# Patient Record
Sex: Female | Born: 1975 | Race: White | Hispanic: No | Marital: Married
Health system: Southern US, Community
[De-identification: ages and names within clinical notes are randomized; demographics above are authoritative.]

---

## 1999-03-20 ENCOUNTER — Other Ambulatory Visit: Admission: RE | Admit: 1999-03-20 | Discharge: 1999-03-20 | Payer: Self-pay | Admitting: *Deleted

## 2000-01-29 ENCOUNTER — Other Ambulatory Visit: Admission: RE | Admit: 2000-01-29 | Discharge: 2000-01-29 | Payer: Self-pay | Admitting: Gynecology

## 2000-07-20 ENCOUNTER — Emergency Department (HOSPITAL_COMMUNITY): Admission: EM | Admit: 2000-07-20 | Discharge: 2000-07-20 | Payer: Self-pay | Admitting: Emergency Medicine

## 2001-02-03 ENCOUNTER — Other Ambulatory Visit: Admission: RE | Admit: 2001-02-03 | Discharge: 2001-02-03 | Payer: Self-pay | Admitting: Gynecology

## 2001-08-05 ENCOUNTER — Other Ambulatory Visit: Admission: RE | Admit: 2001-08-05 | Discharge: 2001-08-05 | Payer: Self-pay | Admitting: Gynecology

## 2002-03-16 ENCOUNTER — Other Ambulatory Visit: Admission: RE | Admit: 2002-03-16 | Discharge: 2002-03-16 | Payer: Self-pay | Admitting: Gynecology

## 2003-03-16 ENCOUNTER — Other Ambulatory Visit: Admission: RE | Admit: 2003-03-16 | Discharge: 2003-03-16 | Payer: Self-pay | Admitting: Gynecology

## 2004-04-04 ENCOUNTER — Other Ambulatory Visit: Admission: RE | Admit: 2004-04-04 | Discharge: 2004-04-04 | Payer: Self-pay | Admitting: Gynecology

## 2005-01-02 ENCOUNTER — Inpatient Hospital Stay (HOSPITAL_COMMUNITY): Admission: AD | Admit: 2005-01-02 | Discharge: 2005-01-02 | Payer: Self-pay | Admitting: Obstetrics and Gynecology

## 2005-03-16 ENCOUNTER — Inpatient Hospital Stay (HOSPITAL_COMMUNITY): Admission: AD | Admit: 2005-03-16 | Discharge: 2005-03-19 | Payer: Self-pay | Admitting: Obstetrics and Gynecology

## 2005-04-27 ENCOUNTER — Other Ambulatory Visit: Admission: RE | Admit: 2005-04-27 | Discharge: 2005-04-27 | Payer: Self-pay | Admitting: Obstetrics and Gynecology

## 2006-08-05 ENCOUNTER — Other Ambulatory Visit: Admission: RE | Admit: 2006-08-05 | Discharge: 2006-08-05 | Payer: Self-pay | Admitting: Gynecology

## 2014-04-07 ENCOUNTER — Other Ambulatory Visit (HOSPITAL_COMMUNITY): Payer: Self-pay | Admitting: Obstetrics and Gynecology

## 2014-04-07 DIAGNOSIS — N971 Female infertility of tubal origin: Secondary | ICD-10-CM

## 2014-04-09 ENCOUNTER — Ambulatory Visit (HOSPITAL_COMMUNITY)
Admission: RE | Admit: 2014-04-09 | Discharge: 2014-04-09 | Disposition: A | Discharge status: Home / Self Care | Payer: Federal, State, Local not specified - PPO | Source: Ambulatory Visit | Attending: Obstetrics and Gynecology | Admitting: Obstetrics and Gynecology

## 2014-04-09 DIAGNOSIS — Z3049 Encounter for surveillance of other contraceptives: Secondary | ICD-10-CM | POA: Insufficient documentation

## 2014-04-09 DIAGNOSIS — N971 Female infertility of tubal origin: Secondary | ICD-10-CM

## 2014-04-09 MED ORDER — IOHEXOL 300 MG/ML  SOLN
20.0000 mL | Freq: Once | INTRAMUSCULAR | Status: AC | PRN
  Administered 2014-04-09: 5 mL

## 2014-04-12 ENCOUNTER — Other Ambulatory Visit (HOSPITAL_COMMUNITY): Payer: Self-pay | Admitting: Obstetrics and Gynecology

## 2014-04-12 DIAGNOSIS — Z308 Encounter for other contraceptive management: Secondary | ICD-10-CM

## 2014-08-27 ENCOUNTER — Ambulatory Visit (HOSPITAL_COMMUNITY)
Admission: RE | Admit: 2014-08-27 | Discharge: 2014-08-27 | Disposition: A | Discharge status: Home / Self Care | Payer: Federal, State, Local not specified - PPO | Source: Ambulatory Visit | Attending: Obstetrics and Gynecology | Admitting: Obstetrics and Gynecology

## 2014-08-27 DIAGNOSIS — Z308 Encounter for other contraceptive management: Secondary | ICD-10-CM | POA: Insufficient documentation

## 2014-08-27 MED ORDER — IOHEXOL 300 MG/ML  SOLN
20.0000 mL | Freq: Once | INTRAMUSCULAR | Status: AC | PRN
  Administered 2014-08-27: 20 mL

## 2015-11-10 ENCOUNTER — Other Ambulatory Visit: Payer: Self-pay | Admitting: Obstetrics and Gynecology

## 2015-11-10 DIAGNOSIS — R928 Other abnormal and inconclusive findings on diagnostic imaging of breast: Secondary | ICD-10-CM

## 2015-11-18 ENCOUNTER — Ambulatory Visit
Admission: RE | Admit: 2015-11-18 | Discharge: 2015-11-18 | Disposition: A | Discharge status: Home / Self Care | Payer: Federal, State, Local not specified - PPO | Source: Ambulatory Visit | Attending: Obstetrics and Gynecology | Admitting: Obstetrics and Gynecology

## 2015-11-18 ENCOUNTER — Other Ambulatory Visit: Payer: Self-pay | Admitting: Obstetrics and Gynecology

## 2015-11-18 DIAGNOSIS — R928 Other abnormal and inconclusive findings on diagnostic imaging of breast: Secondary | ICD-10-CM

## 2015-11-18 DIAGNOSIS — N6310 Unspecified lump in the right breast, unspecified quadrant: Secondary | ICD-10-CM

## 2016-01-07 IMAGING — RF DG HYSTEROGRAM
8 series · 8 of 8 positions shown · IV contrast (omnipaque)
Comparison: None.

FLUOROSCOPY TIME:  1 min 0 seconds

CLINICAL DATA: Status post Essure microinsert placement for
contraception. Evaluate microinsert location and tubal patency.

EXAM:
HYSTEROSALPINGOGRAM
TECHNIQUE: Following cleansing of the cervix and vagina with Betadine solution,
a hysterosalpingogram was performed using a 5-French
hysterosalpingogram catheter and Omnipaque 300 contrast. The patient
tolerated the examination without difficulty.

[Series 1: run · 1 of 1 slices shown (1 of 8)]
[im 1/1]
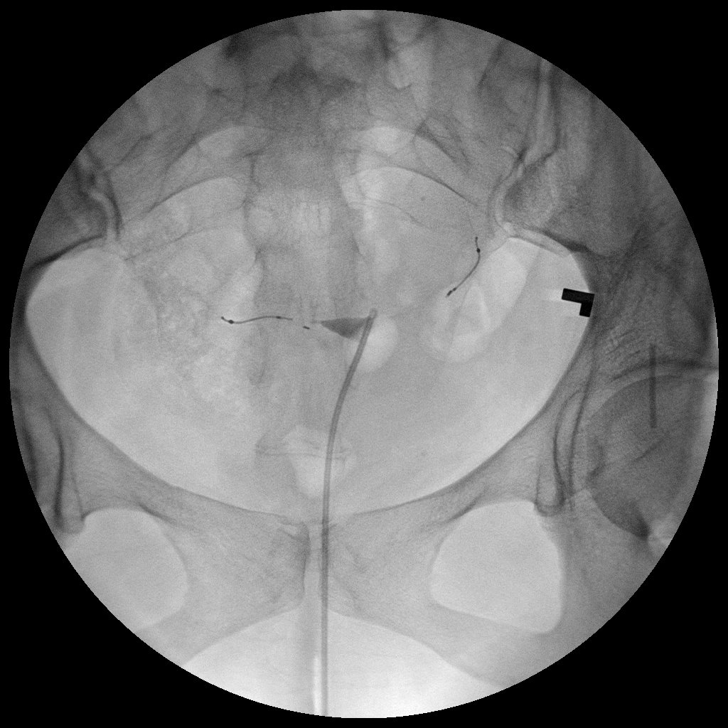

[Series 2: run · 1 of 1 slices shown (2 of 8)]
[im 1/1]
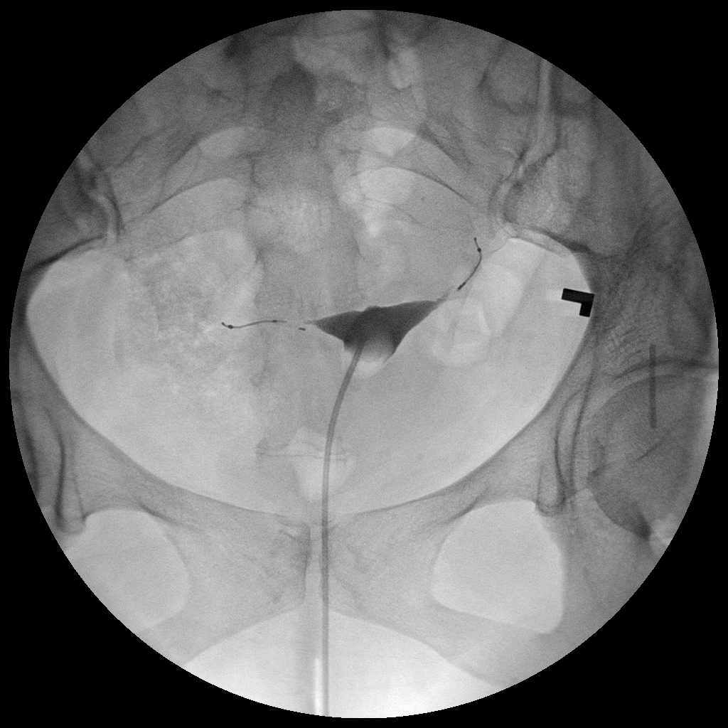

[Series 3: run · 1 of 1 slices shown (3 of 8)]
[im 1/1]
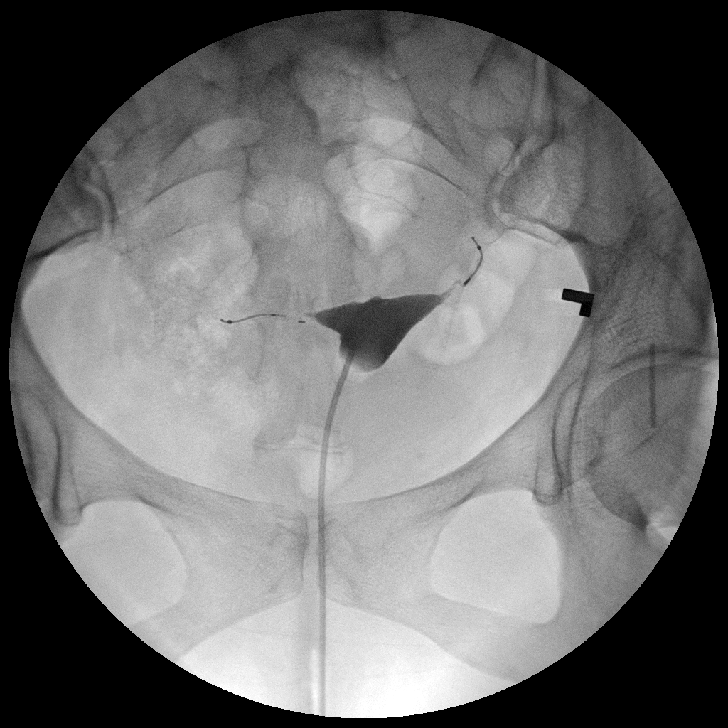

[Series 4: run · 1 of 1 slices shown (4 of 8)]
[im 1/1]
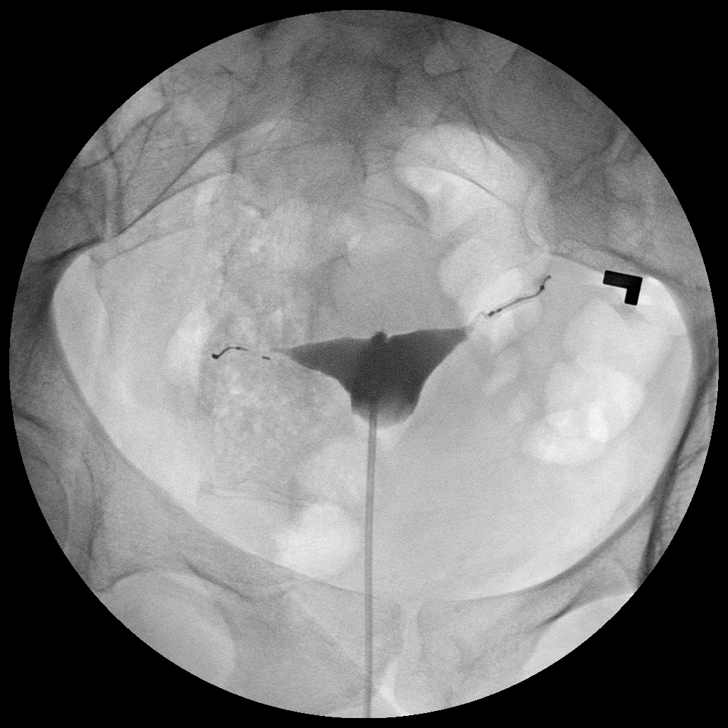

[Series 5: run · 1 of 1 slices shown (5 of 8)]
[im 1/1]
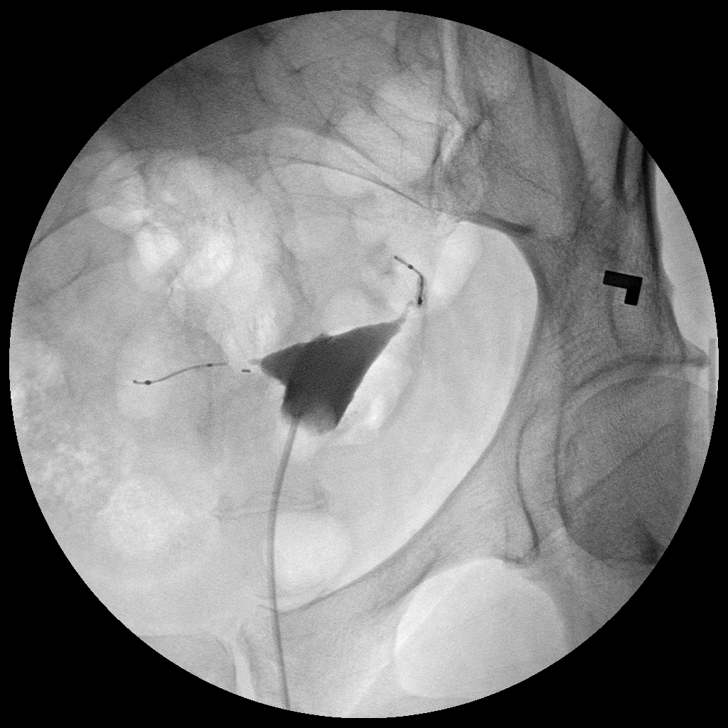

[Series 6: run · 1 of 1 slices shown (6 of 8)]
[im 1/1]
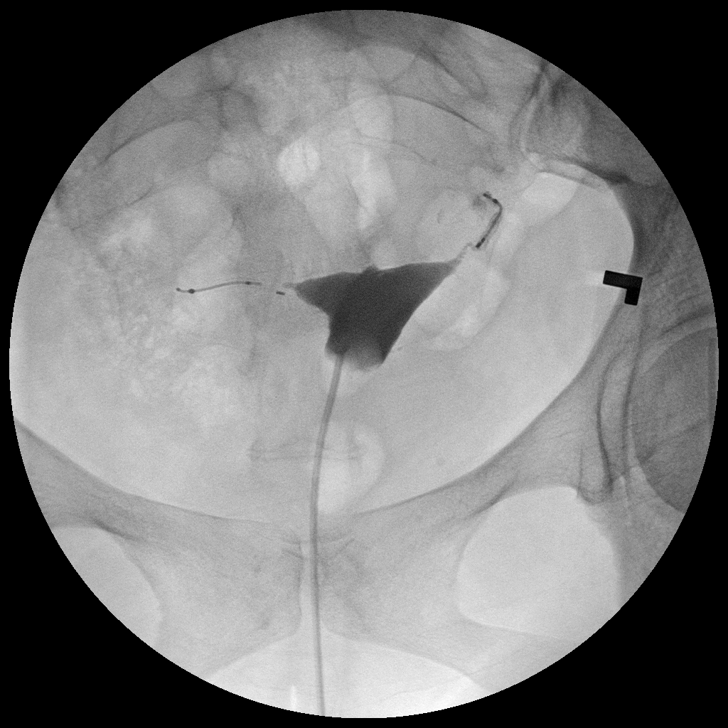

[Series 7: run · 1 of 1 slices shown (7 of 8)]
[im 1/1]
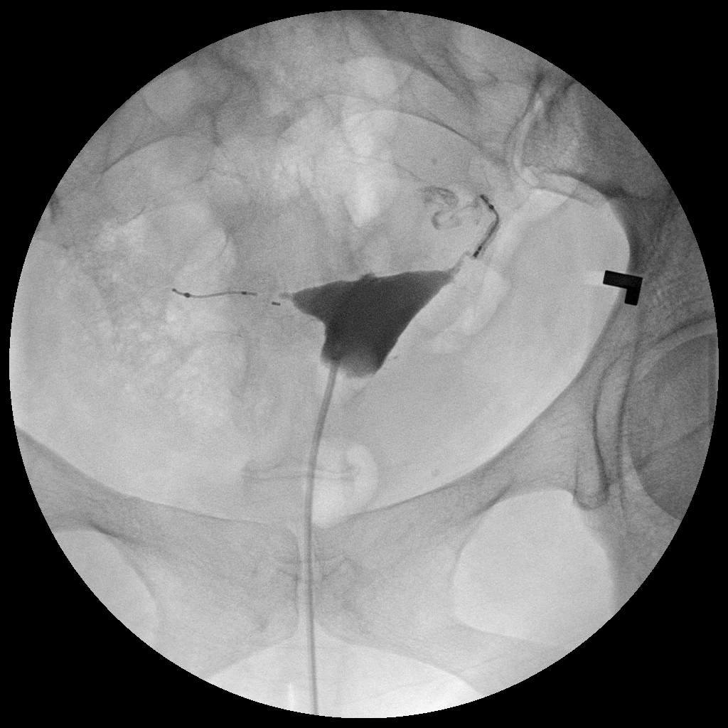

[Series 8: run · 1 of 1 slices shown (8 of 8)]
[im 1/1]
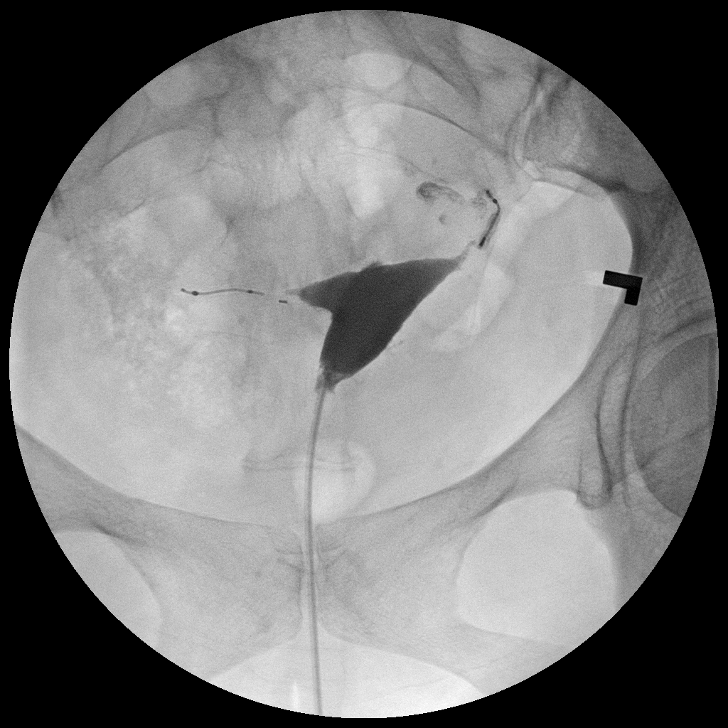

[8 of 8 positions shown; findings below may reference images not displayed]

FINDINGS: Endometrial cavity is normal in appearance. Both Essure microinserts
are in satisfactory location.

The right fallopian tube shows occlusion at the uterine-tubal
junction, and is not opacify with contrast.

The left Fallopian tube shows contrast enhancement/patency beyond
the microinsert (category 3).
IMPRESSION: Both Essure micro inserts are in satisfactory location.

The left fallopian tube shows patency beyond the micro insert
(category 3). Per manufacturer's recommendations, the patient should
not rely on Essure micro-inserts for contraception. This was
discussed with the patient following the exam.

These results will be called to the ordering clinician or
representative by the Radiologist Assistant, and communication
documented in the PACS or zVision Dashboard.

## 2018-08-06 ENCOUNTER — Other Ambulatory Visit: Payer: Self-pay | Admitting: Gynecology

## 2018-08-06 DIAGNOSIS — N6489 Other specified disorders of breast: Secondary | ICD-10-CM
